# Patient Record
Sex: Male | Born: 1970 | Race: Black or African American | Hispanic: No | Marital: Married
Health system: Southern US, Community
[De-identification: ages and names within clinical notes are randomized; demographics above are authoritative.]

---

## 2019-10-27 ENCOUNTER — Other Ambulatory Visit: Payer: Self-pay

## 2019-10-27 DIAGNOSIS — Z20822 Contact with and (suspected) exposure to covid-19: Secondary | ICD-10-CM

## 2019-10-29 LAB — NOVEL CORONAVIRUS, NAA: SARS-CoV-2, NAA: NOT DETECTED

## 2020-04-21 ENCOUNTER — Emergency Department (HOSPITAL_COMMUNITY): Payer: Medicaid Other

## 2020-04-21 ENCOUNTER — Emergency Department (HOSPITAL_COMMUNITY)
Admission: EM | Admit: 2020-04-21 | Discharge: 2020-04-21 | Disposition: A | Discharge status: Home / Self Care | Payer: Medicaid Other | Attending: Emergency Medicine | Admitting: Emergency Medicine

## 2020-04-21 ENCOUNTER — Encounter (HOSPITAL_COMMUNITY): Payer: Self-pay

## 2020-04-21 ENCOUNTER — Other Ambulatory Visit: Payer: Self-pay

## 2020-04-21 DIAGNOSIS — S161XXA Strain of muscle, fascia and tendon at neck level, initial encounter: Secondary | ICD-10-CM | POA: Diagnosis not present

## 2020-04-21 DIAGNOSIS — Y92410 Unspecified street and highway as the place of occurrence of the external cause: Secondary | ICD-10-CM | POA: Diagnosis not present

## 2020-04-21 DIAGNOSIS — Y999 Unspecified external cause status: Secondary | ICD-10-CM | POA: Diagnosis not present

## 2020-04-21 DIAGNOSIS — M25561 Pain in right knee: Secondary | ICD-10-CM | POA: Insufficient documentation

## 2020-04-21 DIAGNOSIS — Y9389 Activity, other specified: Secondary | ICD-10-CM | POA: Insufficient documentation

## 2020-04-21 DIAGNOSIS — S199XXA Unspecified injury of neck, initial encounter: Secondary | ICD-10-CM | POA: Diagnosis present

## 2020-04-21 MED ORDER — CYCLOBENZAPRINE HCL 10 MG PO TABS
10.0000 mg | ORAL_TABLET | Freq: Once | ORAL | Status: AC
  Administered 2020-04-21: 10 mg via ORAL
  Filled 2020-04-21: qty 1

## 2020-04-21 MED ORDER — IBUPROFEN 400 MG PO TABS
600.0000 mg | ORAL_TABLET | Freq: Once | ORAL | Status: AC
  Administered 2020-04-21: 600 mg via ORAL
  Filled 2020-04-21: qty 1

## 2020-04-21 MED ORDER — IBUPROFEN 600 MG PO TABS: 600.0000 mg | ORAL_TABLET | Freq: Four times a day (QID) | ORAL | 0 refills | Status: AC | PRN

## 2020-04-21 MED ORDER — CYCLOBENZAPRINE HCL 10 MG PO TABS: 10.0000 mg | ORAL_TABLET | Freq: Two times a day (BID) | ORAL | 0 refills | Status: AC | PRN

## 2020-04-21 MED ORDER — HYDROCODONE-ACETAMINOPHEN 5-325 MG PO TABS: 1.0000 | ORAL_TABLET | ORAL | 0 refills | Status: AC | PRN
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
## 2020-04-21 NOTE — ED Notes (Signed)
Pt returned from XR at this time. Placed in position of comfort and advised of wait status.

## 2020-04-21 NOTE — Discharge Instructions (Signed)
Take the medications as prescribed. Apply warm compresses for additional comfort.   Return to the ED with any new or worsening symptoms.
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
## 2020-04-21 NOTE — ED Notes (Signed)
Pt transported to XR at this time.
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
